# Patient Record
Sex: Female | Born: 1985 | Race: White | Hispanic: No | Marital: Single
Health system: Southern US, Community
[De-identification: ages and names within clinical notes are randomized; demographics above are authoritative.]

---
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
## 2007-04-01 IMAGING — CR DG CHEST 2V
2 series · 2 of 2 positions shown · non-contrast
Comparison: None.

CLINICAL DATA: Upper abdominal pain and nausea.

[view not recorded (1 of 2)]
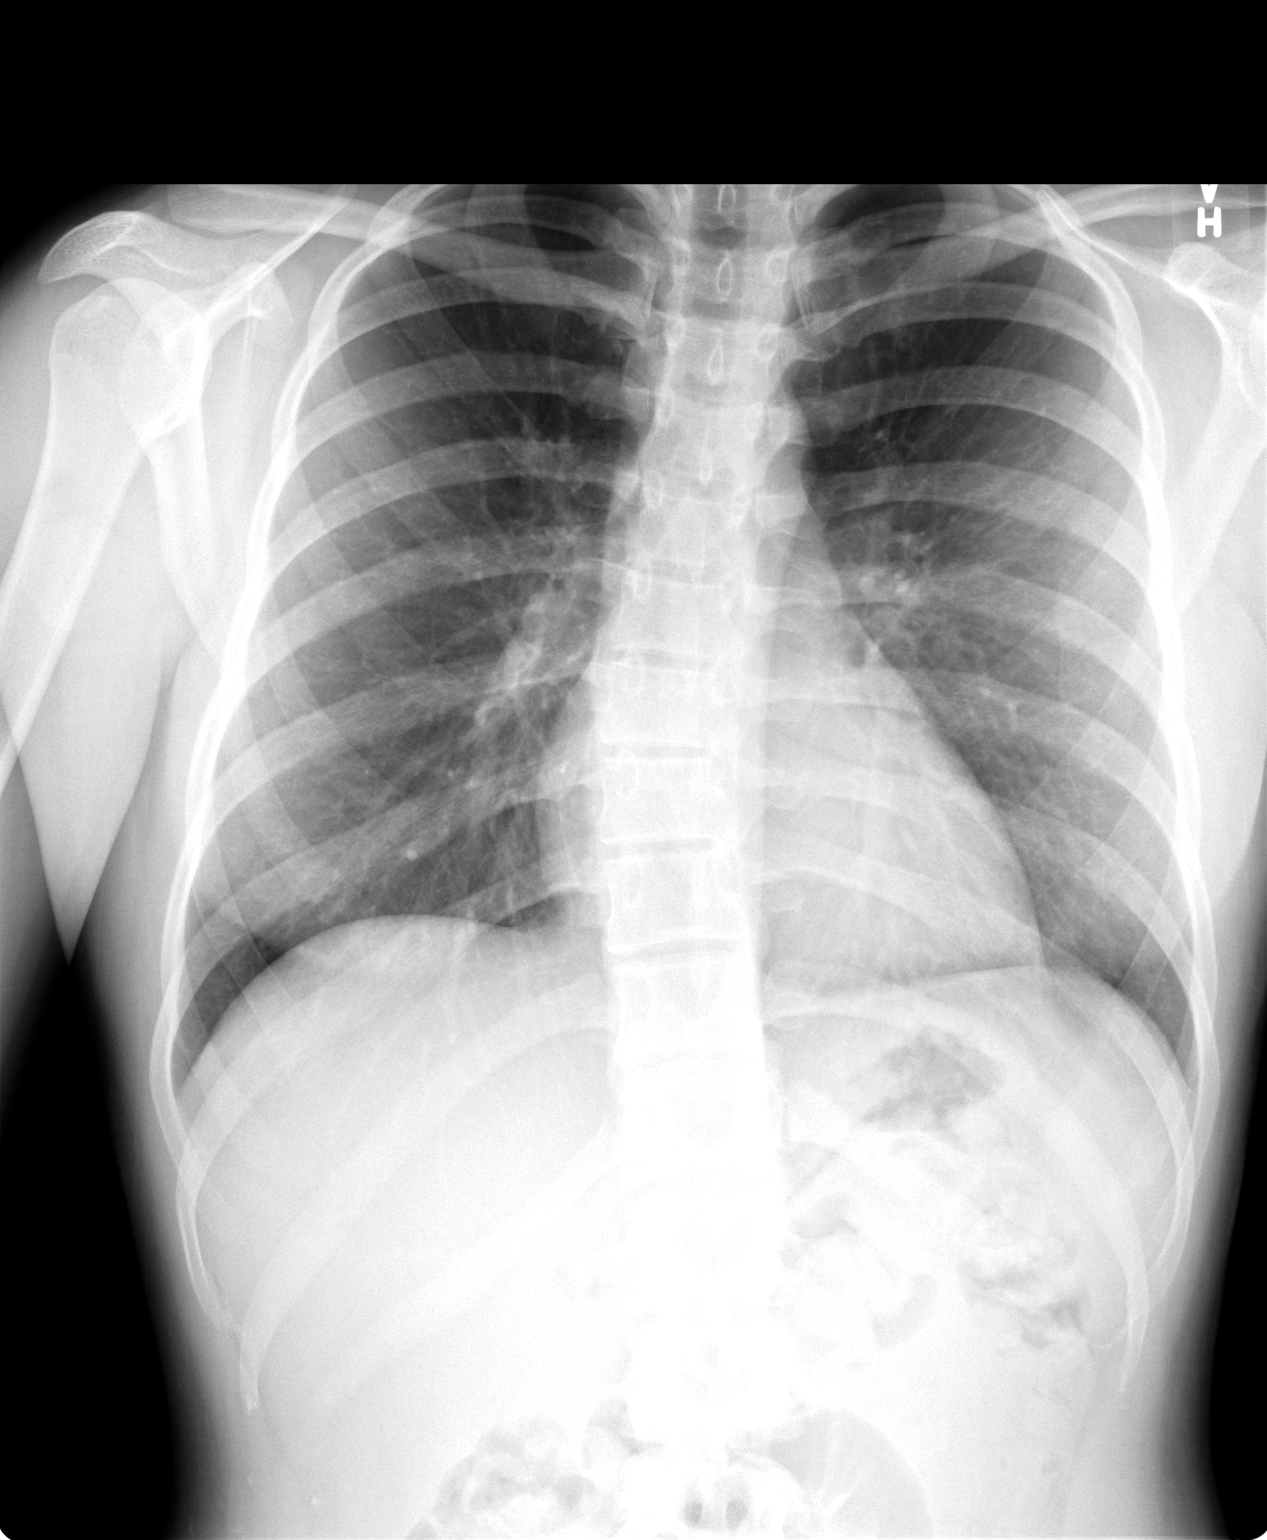

[view not recorded (2 of 2)]
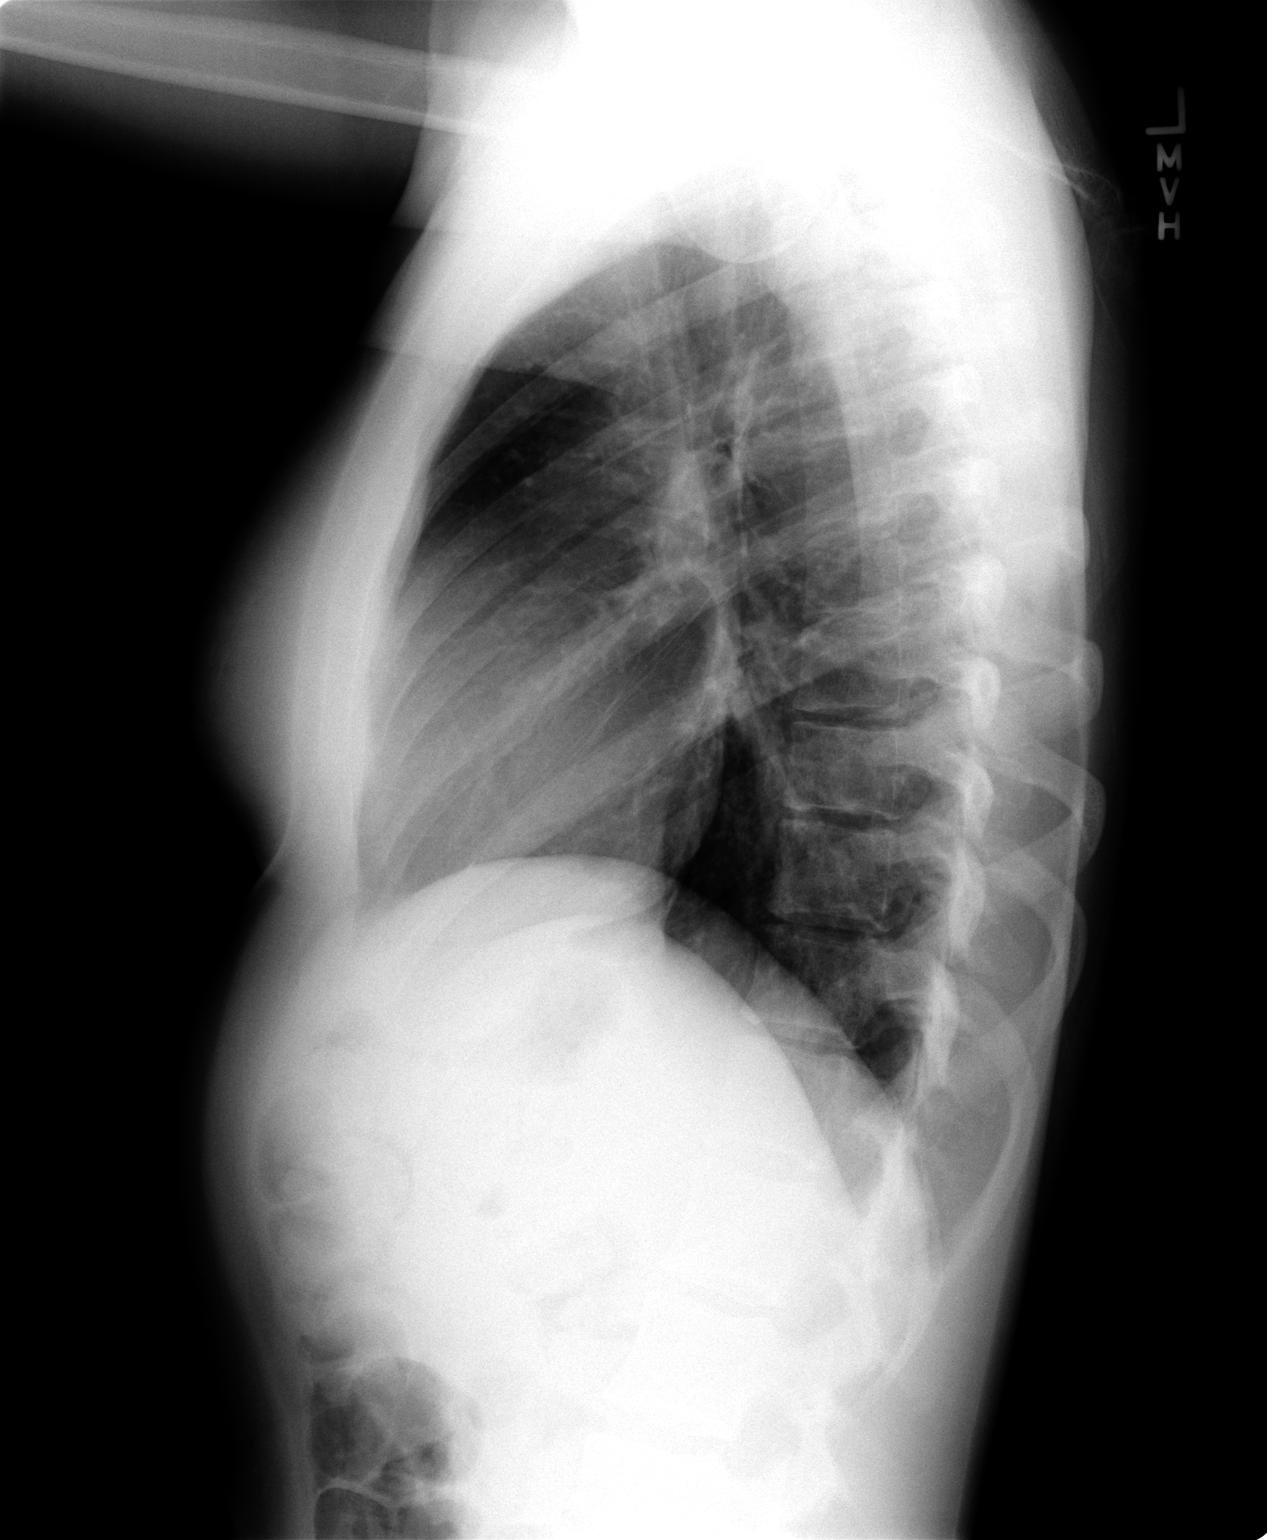

[2 of 2 positions shown; findings below may reference images not displayed]

CHEST - 2 VIEW: 
 The heart size is normal.  There are no effusions.  No focal lung opacities are identified.  There is no pulmonary edema.
IMPRESSION: No acute findings.

## 2007-04-01 IMAGING — US US ABDOMEN COMPLETE
1 series · 14 of 25 positions shown · non-contrast
Comparison: None.

CLINICAL DATA: Upper abdominal pain.  Epigastric pain.  
 ABDOMEN ULTRASOUND:
TECHNIQUE: Complete abdominal ultrasound examination was performed including evaluation of the liver, gallbladder, bile ducts, pancreas, kidneys, spleen, IVC, and abdominal aorta.

[Series 1: abdomen · 0.33mm/px · 14 of 76 slices shown]
[im 1/76]
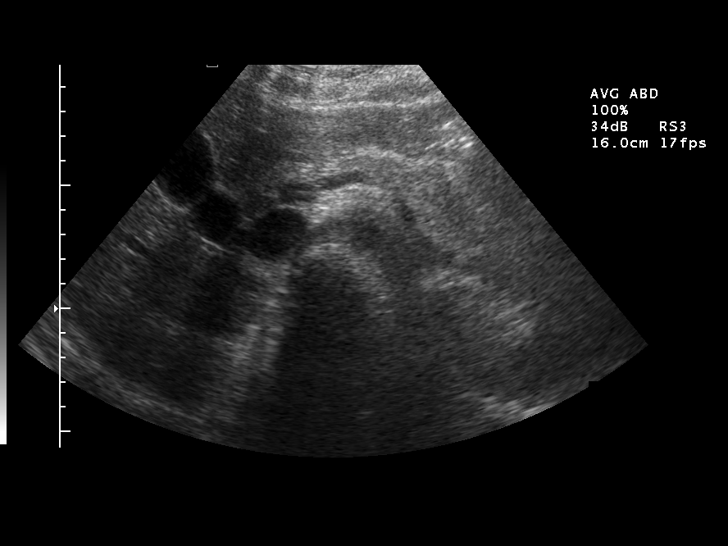
[im 7/76]
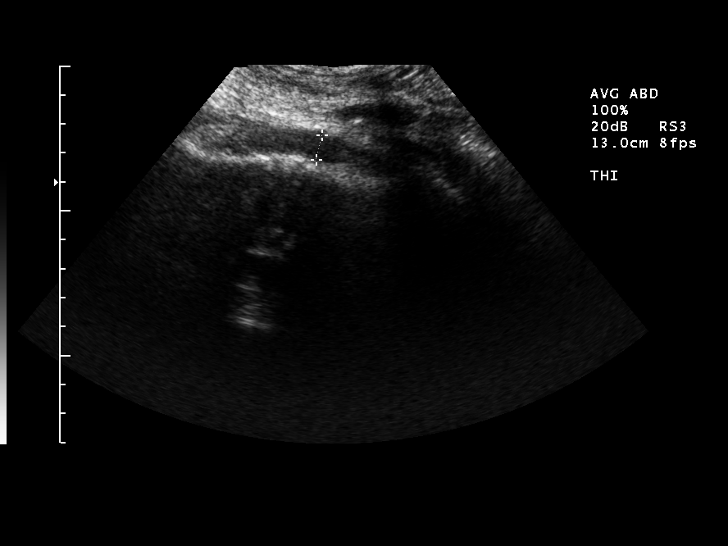
[im 13/76]
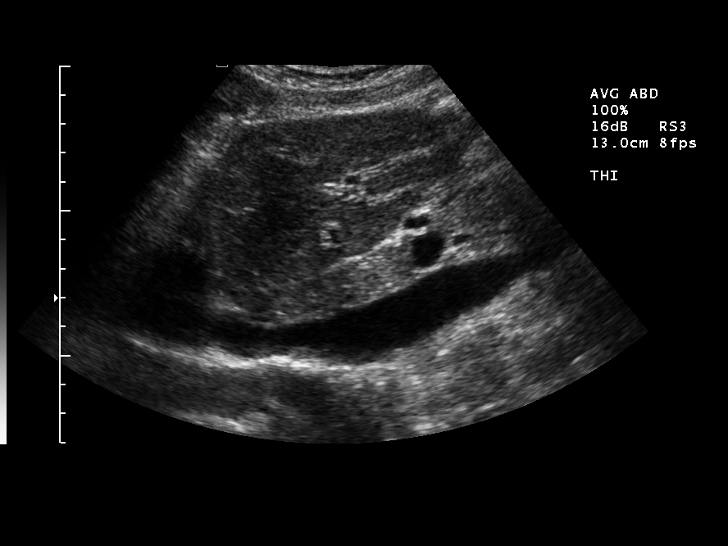
[im 19/76]
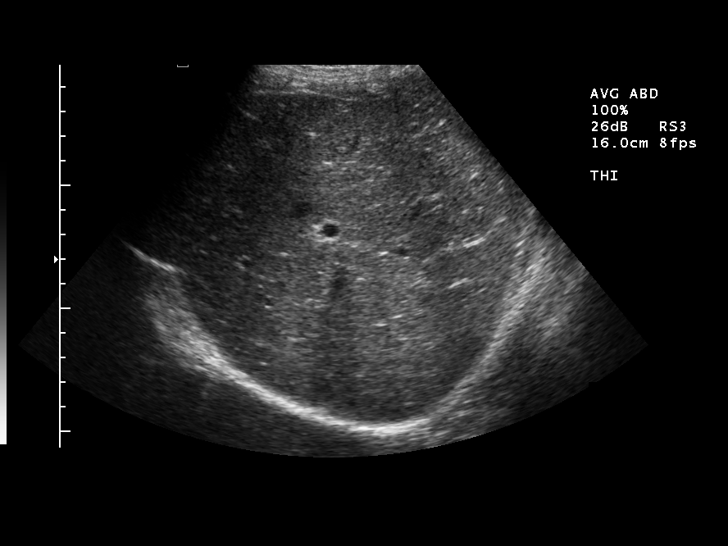
[im 26/76]
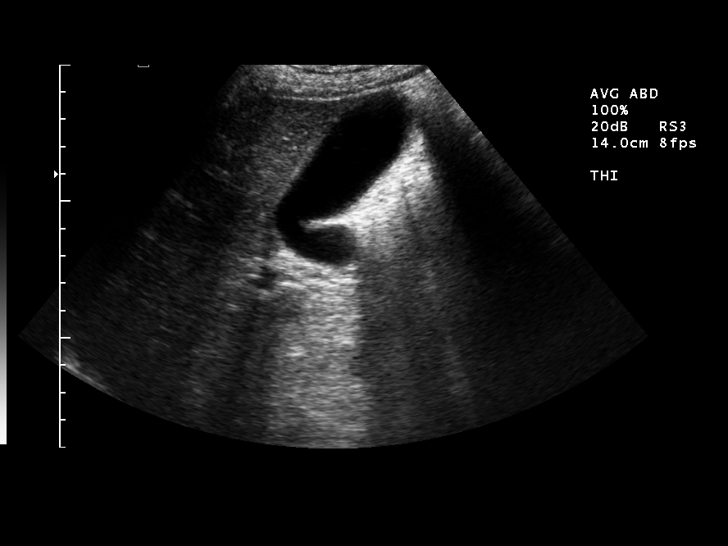
[im 29/76]
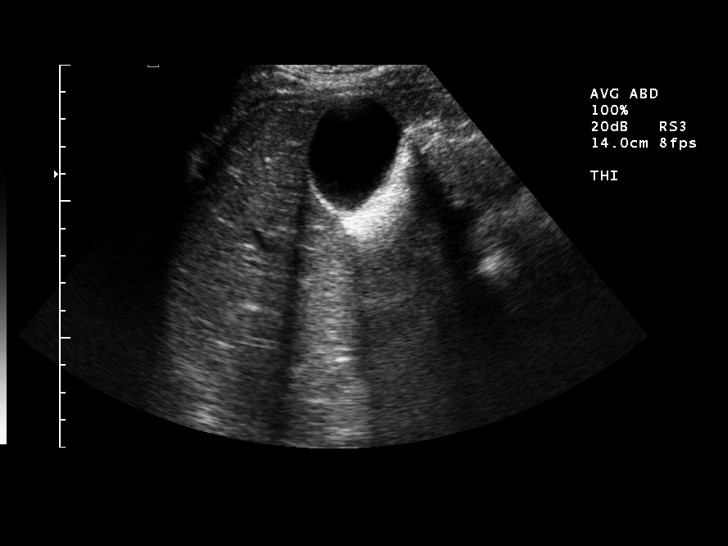
[im 35/76]
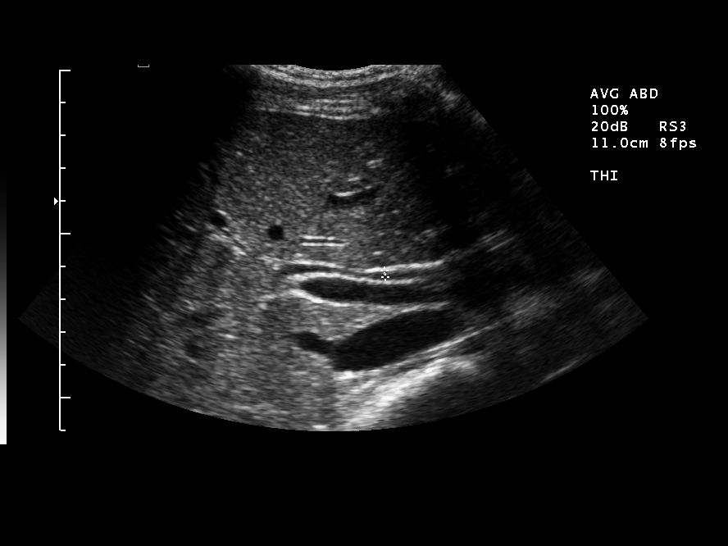
[im 41/76]
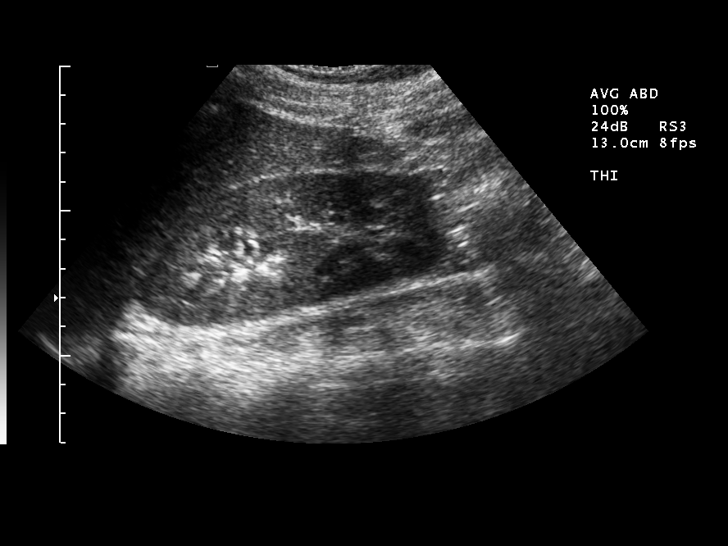
[im 47/76]
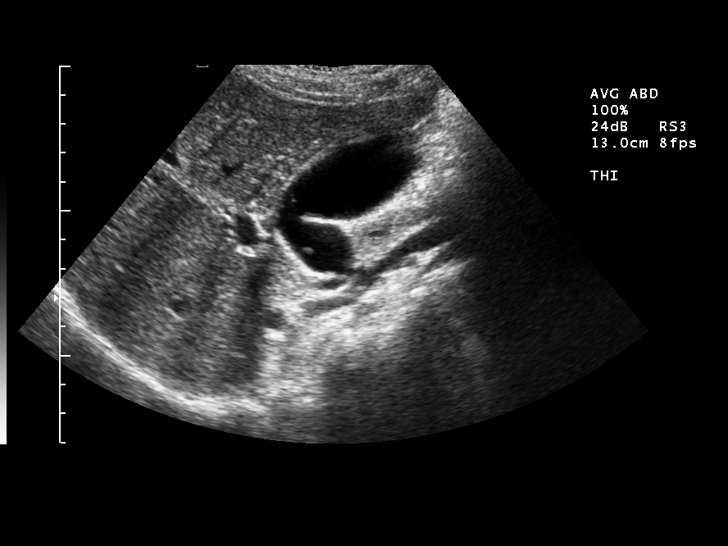
[im 51/76]
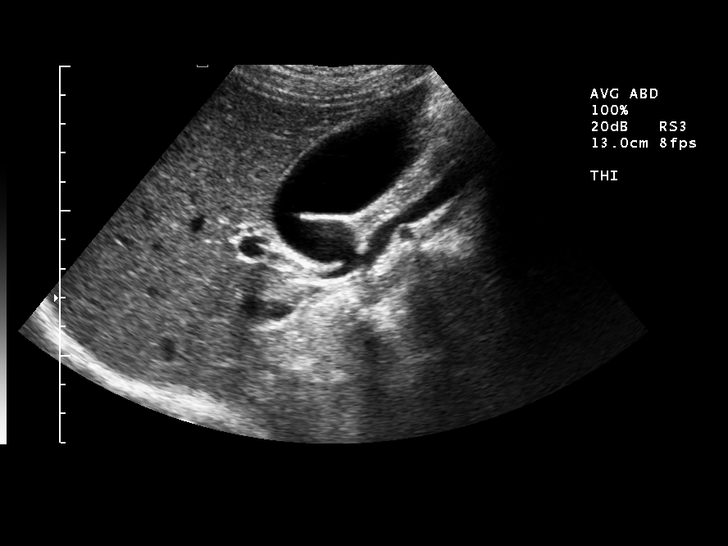
[im 57/76]
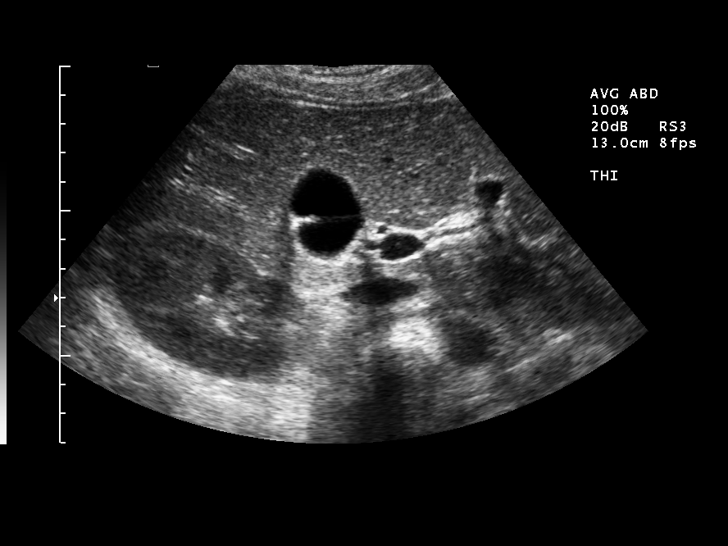
[im 63/76]
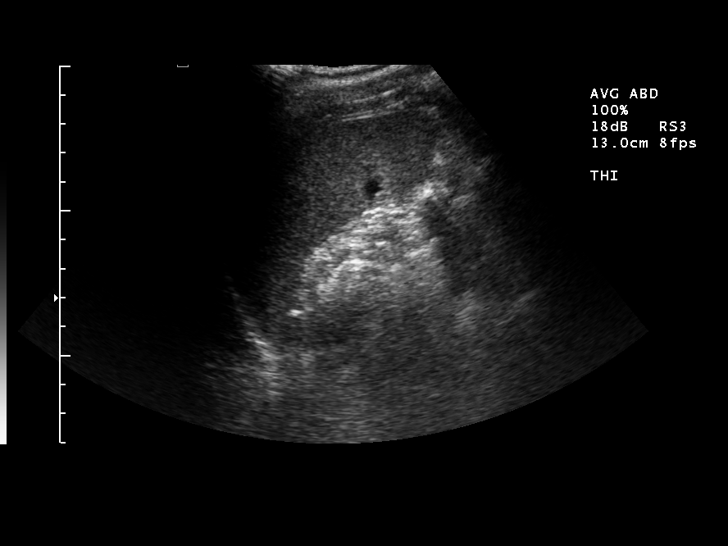
[im 69/76]
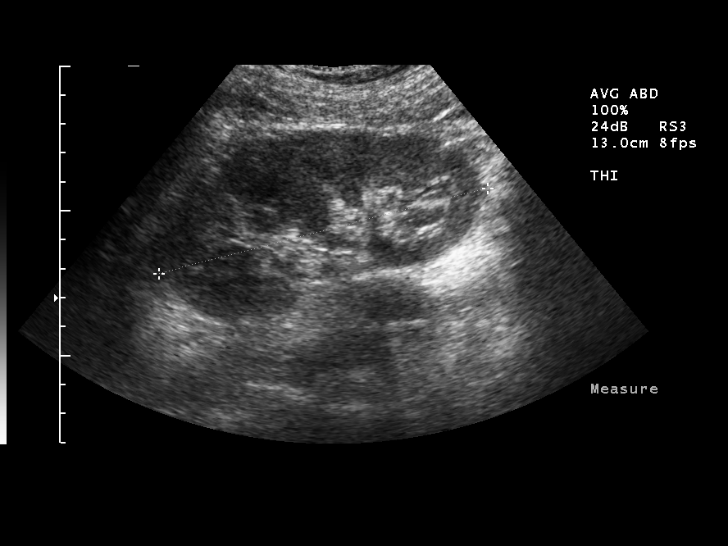
[im 76/76]
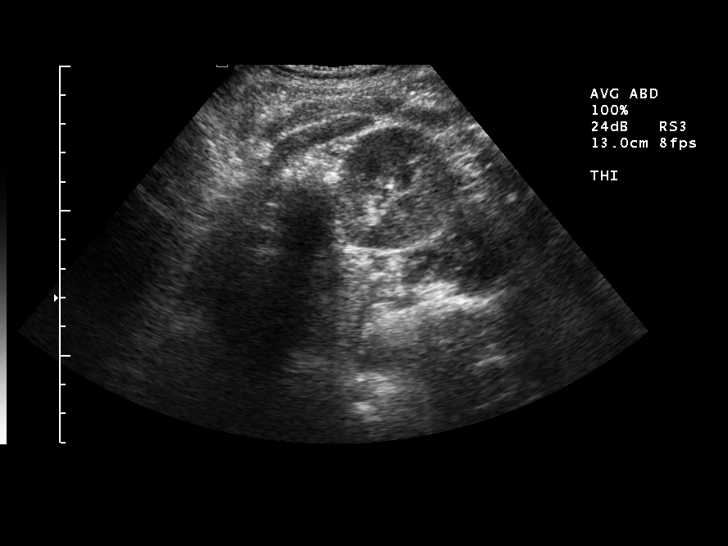

[14 of 25 positions shown; findings below may reference images not displayed]

FINDINGS: Several small polyps are seen within the gallbladder.  There is no gallbladder wall thickening, pericholecystic fluid or stones.  The gallbladder wall thickness measures 1.9 mm.  
 The common bile duct is normal in caliber measuring 2 mm.  
 The liver is normal measuring 15.6 cm.  
 The IVC is negative. 
 Pancreas negative.  
 Spleen normal in length measuring 9.3 cm.  
 Right kidney negative measuring 10.4 cm. 
 Left kidney negative measuring 11.7 cm.  
 The abdominal aorta is normal in AP diameter measuring 1.4 cm in maximum diameter.
IMPRESSION: 1.  No acute findings.  
 2.  No evidence for cholecystitis and no gallstones identified.

## 2012-06-01 ENCOUNTER — Inpatient Hospital Stay: Payer: Self-pay

## 2012-06-01 ENCOUNTER — Observation Stay: Payer: Self-pay | Admitting: *Deleted

## 2012-06-01 LAB — CBC WITH DIFFERENTIAL/PLATELET
Basophil #: 0 10*3/uL (ref 0.0–0.1)
Basophil %: 0 %
Eosinophil #: 0 10*3/uL (ref 0.0–0.7)
Eosinophil %: 0 %
HCT: 35.9 % (ref 35.0–47.0)
HGB: 11.6 g/dL — ABNORMAL LOW (ref 12.0–16.0)
Lymphocyte #: 0.9 10*3/uL — ABNORMAL LOW (ref 1.0–3.6)
Lymphocyte %: 5 %
MCH: 26.6 pg (ref 26.0–34.0)
MCHC: 32.3 g/dL (ref 32.0–36.0)
MCV: 82 fL (ref 80–100)
Monocyte #: 0.6 x10 3/mm (ref 0.2–0.9)
Monocyte %: 3.4 %
Neutrophil #: 15.8 10*3/uL — ABNORMAL HIGH (ref 1.4–6.5)
Neutrophil %: 91.6 %
Platelet: 170 10*3/uL (ref 150–440)
RBC: 4.35 10*6/uL (ref 3.80–5.20)
RDW: 15.8 % — ABNORMAL HIGH (ref 11.5–14.5)
WBC: 17.3 10*3/uL — ABNORMAL HIGH (ref 3.6–11.0)

## 2012-06-03 LAB — HEMATOCRIT: HCT: 25.5 % — ABNORMAL LOW (ref 35.0–47.0)

## 2012-06-05 LAB — PATHOLOGY REPORT

## 2015-03-03 NOTE — Op Note (Signed)
PATIENT NAME:  Cathy Anthony, Cathy Anthony MR#:  [id number] DATE OF BIRTH:  May 23, 1986  DATE OF PROCEDURE:  06/02/2012  PREOPERATIVE DIAGNOSIS: Intrauterine pregnancy, 39 weeks' gestation, abruption, failure to progress.   POSTOPERATIVE DIAGNOSIS: Intrauterine pregnancy, 39 weeks' gestation, abruption, failure to progress.   PROCEDURE PERFORMED: Low transverse cesarean section.   SURGEON: Deloris Pinghilip J. Luella Cookosenow, M.D.   FIRST ASSISTAMargretta Ditty: Jamie DuMayne.  NEONATOLOGIST: Dr. Beckie Saltsasnadi.   PREOPERATIVE SITUATION: A 29 year old Gravida 1, Para 0 who presented at 4239 weeks gestation with a small abruption. The infant appeared to be tolerating this; therefore, was allowed to labor. Ultimately, the patient had a secondary arrest at 5 cm dilated.   OPERATIVE FINDINGS: 8 pounds, 8 ounces female infant delivered at 00:34 a.m.--[place].   DESCRIPTION OF PROCEDURE: After adequate conduction anesthesia, the patient was prepped and draped in routine fashion. A skin incision in modified Pfannenstiel fashion was made and carried down the various layers and the peritoneal cavity was entered. A bladder flap was created and the bladder was pushed down. A low transverse incision was made. The above-described infant was delivered without difficulty. It should be noted that a moderate amount of blood was encountered upon entering the uterus. The placenta was removed manually showing a small abruption. The uterus was then closed with continuous locking suture of chromic one. All areas of surgery were inspected and found to be hemostatic. The rectus muscles were reapproximated in the midline. On-Q pump was placed. The fascia was reapproximated with continuous sutures of Maxon. The skin was closed with skin staples. Estimated blood loss 100    mL. The patient tolerated the procedure well and left the Operating Room in good condition. Sponge and needle counts were said to be correct at the end of the procedure.     ____________________________ Deloris PingPhilip J. Luella Cookosenow, MD pjr:ap D: 06/02/2012 04:09:52 ET T: 06/02/2012 11:16:17 ET JOB#: [id number]  cc: Deloris PingPhilip J. Luella Cookosenow, MD, <Dictator> Towana BadgerPHILIP J Akash Winski MD ELECTRONICALLY SIGNED 06/03/2012 22:31

## 2015-03-24 NOTE — H&P (Signed)
L&D Evaluation:  History:   HPI 29 yo G1 at 4939 weeks gestation [facility] presenting with contractions since this morning and some light vaginal spotting.  +FM, no LOF  [facility].  A+, ABSC neg, RPR NR, HBsAg neg, RI,  VZI, HIV neg, GC & CT neg & neg, 1-hr glucola 87, GBS negatie    Presents with contractions, vaginal bleeding    Patient's Medical History fibromyalgia    Patient's Surgical History ovarian cystectomy, orthopedic surgery    Medications Pre Natal Vitamins    Allergies NKDA    Social History none   ROS:   ROS All systems were reviewed.  HEENT, CNS, GI, GU, Respiratory, CV, Renal and Musculoskeletal systems were found to be normal.   Exam:   Vital Signs stable    General painfully contracting    Mental Status clear    Abdomen gravid, tender with contractions    Estimated Fetal Weight Average for gestational age, vtx    Edema no edema    Pelvic no external lesions, 2/3/-2    Mebranes Intact, some light bleeding noted on check, negative for ferning on microscopy    FHT normal rate with no decels    Ucx regular, 4-6 minutes   Impression:   Impression 29 yo G1 at 7187w0d presenting in early labor vs braxton hicks contractions   Plan:   Plan EFM/NST, monitor contractions and for cervical change    Comments Will recheck later this afternoon, patient aware if no cervical change she will be discharged home if fetal surveillnace remains reassuring   Electronic Signatures: Lorrene ReidStaebler, Radha Coggins M (MD)  (Signed 19-Jul-13 13:01)  Authored: L&D Evaluation   Last Updated: 19-Jul-13 13:01 by Lorrene ReidStaebler, Krishon Adkison M (MD)

## 2015-03-24 NOTE — H&P (Signed)
L&D Evaluation:  History Expanded:   HPI 29   yo G1 presents at term in labor.  [facility].    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, hip arthroscopy, laparoscopic remaval of dermoid, left    Medications Pre Natal Vitamins    Allergies NKDA    Social History none   Exam:   General labor    Mental Status clear    Chest clear    Heart normal sinus rhythm    Abdomen gravid, non-tender    Estimated Fetal Weight Average for gestational age    Pelvic 4 cm    Mebranes Intact    FHT normal rate with no decels    Ucx regular   Impression:   Impression active labor   Electronic Signatures: Towana Badgerosenow, Philip J (MD)  (Signed 19-Jul-13 18:56)  Authored: L&D Evaluation   Last Updated: 19-Jul-13 18:56 by Towana Badgerosenow, Philip J (MD)
